# Patient Record
Sex: Male | Born: 1995 | Race: White | Hispanic: No | Marital: Single | Smoking: Never smoker
Health system: Southern US, Community
[De-identification: ages and names within clinical notes are randomized; demographics above are authoritative.]

## PROBLEM LIST (undated history)

## (undated) DIAGNOSIS — I514 Myocarditis, unspecified: Secondary | ICD-10-CM

## (undated) DIAGNOSIS — Z9289 Personal history of other medical treatment: Secondary | ICD-10-CM

## (undated) DIAGNOSIS — J020 Streptococcal pharyngitis: Secondary | ICD-10-CM

## (undated) HISTORY — DX: Myocarditis, unspecified: I51.4

## (undated) HISTORY — PX: OTHER SURGICAL HISTORY: SHX169

## (undated) HISTORY — DX: Personal history of other medical treatment: Z92.89

---

## 2015-08-23 DIAGNOSIS — J020 Streptococcal pharyngitis: Secondary | ICD-10-CM

## 2015-08-23 HISTORY — DX: Streptococcal pharyngitis: J02.0

## 2015-09-15 ENCOUNTER — Observation Stay
Admission: EM | Admit: 2015-09-15 | Discharge: 2015-09-15 | Disposition: A | Discharge status: Home / Self Care | Payer: Managed Care, Other (non HMO) | Attending: Internal Medicine | Admitting: Internal Medicine

## 2015-09-15 ENCOUNTER — Encounter
Admission: EM | Disposition: A | Discharge status: Home / Self Care | Payer: Self-pay | Source: Home / Self Care | Attending: Emergency Medicine

## 2015-09-15 ENCOUNTER — Other Ambulatory Visit: Payer: Self-pay | Admitting: Cardiovascular Disease

## 2015-09-15 ENCOUNTER — Emergency Department: Payer: Managed Care, Other (non HMO)

## 2015-09-15 ENCOUNTER — Observation Stay (HOSPITAL_BASED_OUTPATIENT_CLINIC_OR_DEPARTMENT_OTHER)
Admit: 2015-09-15 | Discharge: 2015-09-15 | Disposition: A | Discharge status: Home / Self Care | Payer: Managed Care, Other (non HMO) | Attending: Student | Admitting: Student

## 2015-09-15 ENCOUNTER — Encounter: Payer: Self-pay | Admitting: *Deleted

## 2015-09-15 DIAGNOSIS — I209 Angina pectoris, unspecified: Secondary | ICD-10-CM

## 2015-09-15 DIAGNOSIS — I319 Disease of pericardium, unspecified: Secondary | ICD-10-CM | POA: Diagnosis present

## 2015-09-15 DIAGNOSIS — I2510 Atherosclerotic heart disease of native coronary artery without angina pectoris: Secondary | ICD-10-CM | POA: Diagnosis not present

## 2015-09-15 DIAGNOSIS — R079 Chest pain, unspecified: Secondary | ICD-10-CM

## 2015-09-15 DIAGNOSIS — Z8042 Family history of malignant neoplasm of prostate: Secondary | ICD-10-CM | POA: Diagnosis not present

## 2015-09-15 DIAGNOSIS — Q245 Malformation of coronary vessels: Secondary | ICD-10-CM | POA: Diagnosis not present

## 2015-09-15 DIAGNOSIS — I340 Nonrheumatic mitral (valve) insufficiency: Secondary | ICD-10-CM | POA: Insufficient documentation

## 2015-09-15 DIAGNOSIS — R509 Fever, unspecified: Secondary | ICD-10-CM | POA: Insufficient documentation

## 2015-09-15 DIAGNOSIS — I071 Rheumatic tricuspid insufficiency: Secondary | ICD-10-CM | POA: Insufficient documentation

## 2015-09-15 DIAGNOSIS — I214 Non-ST elevation (NSTEMI) myocardial infarction: Secondary | ICD-10-CM | POA: Diagnosis not present

## 2015-09-15 DIAGNOSIS — R748 Abnormal levels of other serum enzymes: Secondary | ICD-10-CM | POA: Insufficient documentation

## 2015-09-15 DIAGNOSIS — R9431 Abnormal electrocardiogram [ECG] [EKG]: Secondary | ICD-10-CM | POA: Diagnosis not present

## 2015-09-15 DIAGNOSIS — R7989 Other specified abnormal findings of blood chemistry: Secondary | ICD-10-CM | POA: Diagnosis present

## 2015-09-15 DIAGNOSIS — R778 Other specified abnormalities of plasma proteins: Secondary | ICD-10-CM | POA: Insufficient documentation

## 2015-09-15 HISTORY — DX: Streptococcal pharyngitis: J02.0

## 2015-09-15 HISTORY — PX: CARDIAC CATHETERIZATION: SHX172

## 2015-09-15 LAB — BASIC METABOLIC PANEL
Anion gap: 8 (ref 5–15)
BUN: 9 mg/dL (ref 6–20)
CALCIUM: 9.3 mg/dL (ref 8.9–10.3)
CHLORIDE: 101 mmol/L (ref 101–111)
CO2: 27 mmol/L (ref 22–32)
CREATININE: 0.88 mg/dL (ref 0.61–1.24)
GFR calc non Af Amer: >60 mL/min (ref >60)
GLUCOSE: 113 mg/dL — AB (ref 65–99)
Potassium: 3.7 mmol/L (ref 3.5–5.1)
Sodium: 136 mmol/L (ref 135–145)

## 2015-09-15 LAB — URINE DRUG SCREEN, QUALITATIVE (ARMC ONLY)
AMPHETAMINES, UR SCREEN: NOT DETECTED
Barbiturates, Ur Screen: NOT DETECTED
Benzodiazepine, Ur Scrn: NOT DETECTED
Cannabinoid 50 Ng, Ur ~~LOC~~: NOT DETECTED
Cocaine Metabolite,Ur ~~LOC~~: NOT DETECTED
MDMA (ECSTASY) UR SCREEN: NOT DETECTED
Methadone Scn, Ur: NOT DETECTED
Opiate, Ur Screen: NOT DETECTED
Phencyclidine (PCP) Ur S: NOT DETECTED
TRICYCLIC, UR SCREEN: NOT DETECTED

## 2015-09-15 LAB — FIBRIN DERIVATIVES D-DIMER (ARMC ONLY): Fibrin derivatives D-dimer (ARMC): 655 — ABNORMAL HIGH (ref 0–499)

## 2015-09-15 LAB — CBC
HEMATOCRIT: 44.5 % (ref 40.0–52.0)
HEMOGLOBIN: 15.5 g/dL (ref 13.0–18.0)
MCH: 29.4 pg (ref 26.0–34.0)
MCHC: 34.7 g/dL (ref 32.0–36.0)
MCV: 84.6 fL (ref 80.0–100.0)
Platelets: 218 10*3/uL (ref 150–440)
RBC: 5.26 MIL/uL (ref 4.40–5.90)
RDW: 13.2 % (ref 11.5–14.5)
WBC: 9.2 10*3/uL (ref 3.8–10.6)

## 2015-09-15 LAB — PROTIME-INR
INR: 1.58
Prothrombin Time: 18.9 seconds — ABNORMAL HIGH (ref 11.4–15.0)

## 2015-09-15 LAB — TROPONIN I
TROPONIN I: 1.65 ng/mL — AB (ref <0.031)
TROPONIN I: 3.92 ng/mL — AB (ref <0.031)
TROPONIN I: 2.13 ng/mL — AB (ref <0.031)

## 2015-09-15 SURGERY — LEFT HEART CATH
Anesthesia: Moderate Sedation

## 2015-09-15 MED ORDER — SODIUM CHLORIDE 0.9 % IJ SOLN: 3.0000 mL | INTRAMUSCULAR | Status: DC | PRN

## 2015-09-15 MED ORDER — FENTANYL CITRATE (PF) 100 MCG/2ML IJ SOLN
INTRAMUSCULAR | Status: DC | PRN
  Administered 2015-09-15: 25 ug via INTRAVENOUS
  Administered 2015-09-15: 50 ug via INTRAVENOUS
  Administered 2015-09-15: 25 ug via INTRAVENOUS

## 2015-09-15 MED ORDER — MORPHINE SULFATE (PF) 2 MG/ML IV SOLN: 2.0000 mg | INTRAVENOUS | Status: DC | PRN

## 2015-09-15 MED ORDER — ASPIRIN EC 81 MG PO TBEC
81.0000 mg | DELAYED_RELEASE_TABLET | Freq: Every day | ORAL | Status: DC
Start: 2015-09-16 — End: 2015-09-15

## 2015-09-15 MED ORDER — METOPROLOL SUCCINATE ER 50 MG PO TB24
25.0000 mg | ORAL_TABLET | Freq: Every day | ORAL | Status: DC
  Administered 2015-09-15: 25 mg via ORAL
  Filled 2015-09-15: qty 1

## 2015-09-15 MED ORDER — MIDAZOLAM HCL 2 MG/2ML IJ SOLN
INTRAMUSCULAR | Status: AC
  Filled 2015-09-15: qty 2

## 2015-09-15 MED ORDER — ACETAMINOPHEN 325 MG PO TABS: 650.0000 mg | ORAL_TABLET | ORAL | Status: DC | PRN

## 2015-09-15 MED ORDER — SODIUM CHLORIDE 0.9 % IV SOLN: 250.0000 mL | INTRAVENOUS | Status: DC | PRN

## 2015-09-15 MED ORDER — SODIUM CHLORIDE 0.9 % WEIGHT BASED INFUSION
3.0000 mL/kg/h | INTRAVENOUS | Status: AC
  Administered 2015-09-15: 3 mL/kg/h via INTRAVENOUS

## 2015-09-15 MED ORDER — METOPROLOL SUCCINATE ER 25 MG PO TB24: 25.0000 mg | ORAL_TABLET | Freq: Every day | ORAL | Status: DC

## 2015-09-15 MED ORDER — ASPIRIN 81 MG PO CHEW: 324.0000 mg | CHEWABLE_TABLET | ORAL | Status: DC

## 2015-09-15 MED ORDER — HEPARIN (PORCINE) IN NACL 2-0.9 UNIT/ML-% IJ SOLN
INTRAMUSCULAR | Status: AC
  Filled 2015-09-15: qty 1000

## 2015-09-15 MED ORDER — NITROGLYCERIN 0.4 MG SL SUBL: 0.4000 mg | SUBLINGUAL_TABLET | SUBLINGUAL | Status: AC | PRN

## 2015-09-15 MED ORDER — ENOXAPARIN SODIUM 100 MG/ML ~~LOC~~ SOLN
1.0000 mg/kg | Freq: Once | SUBCUTANEOUS | Status: AC
  Administered 2015-09-15: 90 mg via SUBCUTANEOUS
  Filled 2015-09-15: qty 1

## 2015-09-15 MED ORDER — SODIUM CHLORIDE 0.9 % IJ SOLN: 3.0000 mL | Freq: Two times a day (BID) | INTRAMUSCULAR | Status: DC

## 2015-09-15 MED ORDER — SODIUM CHLORIDE 0.9 % IV SOLN
INTRAVENOUS | Status: DC
  Administered 2015-09-15: 08:00:00 via INTRAVENOUS

## 2015-09-15 MED ORDER — FENTANYL CITRATE (PF) 100 MCG/2ML IJ SOLN
INTRAMUSCULAR | Status: AC
  Filled 2015-09-15: qty 2

## 2015-09-15 MED ORDER — ONDANSETRON HCL 4 MG/2ML IJ SOLN: 4.0000 mg | Freq: Four times a day (QID) | INTRAMUSCULAR | Status: DC | PRN

## 2015-09-15 MED ORDER — IOHEXOL 300 MG/ML  SOLN
INTRAMUSCULAR | Status: DC | PRN
  Administered 2015-09-15: 110 mL via INTRA_ARTICULAR

## 2015-09-15 MED ORDER — ASPIRIN 300 MG RE SUPP: 300.0000 mg | RECTAL | Status: DC

## 2015-09-15 MED ORDER — MIDAZOLAM HCL 2 MG/2ML IJ SOLN
INTRAMUSCULAR | Status: DC | PRN
  Administered 2015-09-15: 1 mg via INTRAVENOUS
  Administered 2015-09-15 (×2): 0.5 mg via INTRAVENOUS

## 2015-09-15 MED ORDER — ASPIRIN 81 MG PO CHEW
324.0000 mg | CHEWABLE_TABLET | Freq: Once | ORAL | Status: AC
  Administered 2015-09-15: 324 mg via ORAL
  Filled 2015-09-15: qty 4

## 2015-09-15 MED ORDER — PANTOPRAZOLE SODIUM 40 MG PO TBEC
40.0000 mg | DELAYED_RELEASE_TABLET | Freq: Every day | ORAL | Status: DC
  Administered 2015-09-15: 40 mg via ORAL
  Filled 2015-09-15: qty 1

## 2015-09-15 MED ORDER — ASPIRIN 81 MG PO CHEW: 81.0000 mg | CHEWABLE_TABLET | Freq: Every day | ORAL | Status: DC

## 2015-09-15 MED ORDER — IOHEXOL 350 MG/ML SOLN
100.0000 mL | Freq: Once | INTRAVENOUS | Status: AC | PRN
  Administered 2015-09-15: 100 mL via INTRAVENOUS

## 2015-09-15 MED ORDER — KETOROLAC TROMETHAMINE 30 MG/ML IJ SOLN
30.0000 mg | Freq: Once | INTRAMUSCULAR | Status: AC
  Administered 2015-09-15: 30 mg via INTRAVENOUS
  Filled 2015-09-15: qty 1

## 2015-09-15 MED ORDER — NITROGLYCERIN 0.4 MG SL SUBL
0.4000 mg | SUBLINGUAL_TABLET | SUBLINGUAL | Status: DC | PRN
Start: 2015-09-15 — End: 2015-09-15

## 2015-09-15 MED ORDER — AMOXICILLIN-POT CLAVULANATE 875-125 MG PO TABS
1.0000 | ORAL_TABLET | Freq: Two times a day (BID) | ORAL | Status: DC
  Administered 2015-09-15: 1 via ORAL
  Filled 2015-09-15 (×2): qty 1

## 2015-09-15 MED ORDER — NITROGLYCERIN 2 % TD OINT
1.0000 [in_us] | TOPICAL_OINTMENT | Freq: Four times a day (QID) | TRANSDERMAL | Status: DC
Start: 2015-09-15 — End: 2015-09-15
  Administered 2015-09-15: 1 [in_us] via TOPICAL
  Filled 2015-09-15: qty 1

## 2015-09-15 MED ORDER — ENOXAPARIN SODIUM 100 MG/ML ~~LOC~~ SOLN: 1.0000 mg/kg | Freq: Two times a day (BID) | SUBCUTANEOUS | Status: DC

## 2015-09-15 MED ORDER — ASPIRIN 81 MG PO TBEC: 81.0000 mg | DELAYED_RELEASE_TABLET | Freq: Every day | ORAL | Status: AC

## 2015-09-15 MED ORDER — SODIUM CHLORIDE 0.9 % WEIGHT BASED INFUSION
1.0000 mL/kg/h | INTRAVENOUS | Status: DC
Start: 2015-09-15 — End: 2015-09-15
  Administered 2015-09-15: 1 mL/kg/h via INTRAVENOUS

## 2015-09-15 SURGICAL SUPPLY — 10 items
CATH INFINITI 5FR ANG PIGTAIL (CATHETERS) ×3 IMPLANT
CATH INFINITI 5FR JL4 (CATHETERS) ×3 IMPLANT
CATH INFINITI JR4 5F (CATHETERS) ×3 IMPLANT
DEVICE CLOSURE MYNXGRIP 5F (Vascular Products) ×3 IMPLANT
KIT MANI 3VAL PERCEP (MISCELLANEOUS) ×3 IMPLANT
NEEDLE PERC 18GX7CM (NEEDLE) ×3 IMPLANT
NEEDLE SMART 18G ACCESS (NEEDLE) ×3 IMPLANT
PACK CARDIAC CATH (CUSTOM PROCEDURE TRAY) ×3 IMPLANT
SHEATH AVANTI 5FR X 11CM (SHEATH) ×3 IMPLANT
WIRE EMERALD 3MM-J .035X150CM (WIRE) ×3 IMPLANT

## 2015-09-15 NOTE — Progress Notes (Signed)
ANTICOAGULATION CONSULT NOTE - Initial Consult  Pharmacy Consult for Lovenox Indication: chest pain/ACS  No Known Allergies  Patient Measurements: Height:  (175.3 cm) Weight: 195 lb (88.451 kg) IBW/kg (Calculated) : 70.7   Vital Signs: Temp: 98.3 F (36.8 C) (01/24 0714) Temp Source: Oral (01/24 0714) BP: 119/77 mmHg (01/24 0714) Pulse Rate: 65 (01/24 0714)  Labs:  Recent Labs  09/15/15 0139 09/15/15 0501  HGB 15.5  --   HCT 44.5  --   PLT 218  --   CREATININE 0.88  --   TROPONINI 1.65* 3.92*    Estimated Creatinine Clearance: 146.1 mL/min (by C-G formula based on Cr of 0.88).   Medical History: History reviewed. No pertinent past medical history.  Medications:  Scheduled:  . amoxicillin-clavulanate  1 tablet Oral Q12H  . aspirin  324 mg Oral NOW   Or  . aspirin  300 mg Rectal NOW  . [START ON 09/16/2015] aspirin EC  81 mg Oral Daily  . enoxaparin (LOVENOX) injection  1 mg/kg Subcutaneous Q12H  . nitroGLYCERIN  1 inch Topical 4 times per day  . pantoprazole  40 mg Oral Daily   Infusions:  . sodium chloride      Assessment: Patient is a 20 yo male presented to ED with chest pain. Pharmacy consulted to dose Lovenox for anticoagulation for possible ACS/NSTEMI/chest pain.   Est CrCl~120 mL/min, Wt: 88.5 kg  Goal of Therapy:  Monitor CBC and SCr per policy   Plan:  Patient received Lovenox 90 mg subq once in ED at 0545.  Will transition patient to Lovenox 1 mg/kg subq q12h based on renal function.  Orders entered for Lovenox 90 mg subq q12h with start time of 1800.    Pharmacy will continue to follow.   Jakylah Bassinger G 09/15/2015,7:28 AM

## 2015-09-15 NOTE — Progress Notes (Signed)
*  PRELIMINARY RESULTS* Echocardiogram 2D Echocardiogram has been performed.  Brad Bernard Hege 09/15/2015, 10:00 AM

## 2015-09-15 NOTE — Discharge Instructions (Signed)
No strenuous activity or contact sports

## 2015-09-15 NOTE — Discharge Summary (Signed)
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 Strep throat 08/2015    HOSPITAL COURSE:   1. Elevated troponin and chest pain. 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 Cardiology recommended aspirin, metoprolol and when necessary nitroglycerin. Dr. Mariah Milling is not calling this a myocardial infarction. He recommended no strenuous activity. And follow-up as outpatient with Dr. Mariah Milling one week. 2. Strep throat can tin you his Penicillin VK  DISCHARGE CONDITIONS:   Satisfactory  CONSULTS OBTAINED:  Treatment Team:  Antonieta Iba, MD  DRUG ALLERGIES:  No Known Allergies  DISCHARGE MEDICATIONS:   Current Discharge Medication List    START taking these medications   Details  aspirin EC 81 MG EC tablet Take 1 tablet (81 mg total) by mouth daily. Qty: 30 tablet, Refills: 0    metoprolol succinate (TOPROL-XL) 25 MG 24 hr tablet Take 1 tablet (25 mg total) by mouth daily. Take with or immediately following a meal. Qty: 30 tablet, Refills: 0    nitroGLYCERIN (NITROSTAT) 0.4 MG SL tablet Place 1 tablet (0.4 mg total) under the tongue every 5 (five) minutes x 3 doses as needed for chest pain (as needed for chest pain). Qty: 30 tablet, Refills: 0      CONTINUE these medications which have NOT CHANGED   Details  penicillin v potassium (VEETID) 500 MG tablet Take 500 mg by  mouth 2 (two) times daily.         DISCHARGE INSTRUCTIONS:   Follow-up with Dr. Mariah Milling cardiology 1 week  If you experience worsening of your admission symptoms, develop shortness of breath, life threatening emergency, suicidal or homicidal thoughts you must seek medical attention immediately by calling 911 or calling your MD immediately  if symptoms less severe.  You Must read complete instructions/literature along with all the possible adverse reactions/side effects for all the Medicines you take and that have been prescribed to you. Take any new Medicines after you have completely understood and accept all the possible adverse reactions/side effects.   Please note  You were cared for by a hospitalist during your hospital stay. If you have any questions about your discharge medications or the care you received while you were in the hospital after you are discharged, you can call the unit and asked to speak with the hospitalist on call if the hospitalist that took care of you is not available. Once you are discharged, your primary care physician will handle any further medical issues. Please note that NO REFILLS for any discharge medications will be authorized once you are discharged, as it is imperative that you return to your primary care physician (or establish a relationship with a primary care physician if you do not have one) for your aftercare needs so that they can reassess your need for medications and monitor your lab values.    Today   CHIEF COMPLAINT:   Chief Complaint  Patient presents with  . Chest Pain  HISTORY OF PRESENT ILLNESS:  Brad Bernard  is a 20 y.o. male recent diagnosis of strep throat and developed chest pain and found to have an elevated troponin   VITAL SIGNS:  Blood pressure 127/81, pulse 97, temperature 98.9 F (37.2 C), temperature source Oral, resp. rate 16, height  (1.753 m), weight 88.451 kg (195 lb), SpO2 99 %.    PHYSICAL  EXAMINATION:  GENERAL:  20 y.o.-year-old patient lying in the bed with no acute distress.  EYES: Pupils equal, round, reactive to light and accommodation. No scleral icterus. Extraocular muscles intact.  HEENT: Head atraumatic, normocephalic. Throat slight erythema but no exudates NECK:  Supple, no jugular venous distention. No thyroid enlargement, no tenderness.  LUNGS: Normal breath sounds bilaterally, no wheezing, rales,rhonchi or crepitation. No use of accessory muscles of respiration.  CARDIOVASCULAR: S1, S2 normal. No murmurs, rubs, or gallops.  ABDOMEN: Soft, non-tender, non-distended. Bowel sounds present. No organomegaly or mass.  EXTREMITIES: No pedal edema, cyanosis, or clubbing.  NEUROLOGIC: Cranial nerves II through XII are intact. Muscle strength 5/5 in all extremities. Sensation intact. Gait not checked.  PSYCHIATRIC: The patient is alert and oriented x 3.  SKIN: No obvious rash, lesion, or ulcer.   DATA REVIEW:   CBC  Recent Labs Lab 09/15/15 0139  WBC 9.2  HGB 15.5  HCT 44.5  PLT 218    Chemistries   Recent Labs Lab 09/15/15 0139  NA 136  K 3.7  CL 101  CO2 27  GLUCOSE 113*  BUN 9  CREATININE 0.88  CALCIUM 9.3    Cardiac Enzymes  Recent Labs Lab 09/15/15 1157  TROPONINI 2.13*    RADIOLOGY:  Dg Chest 2 View  09/15/2015  CLINICAL DATA:  Acute onset of mid chest pain.  Initial encounter. EXAM: CHEST  2 VIEW COMPARISON:  None. FINDINGS: The lungs are well-aerated and clear. There is no evidence of focal opacification, pleural effusion or pneumothorax. The heart is normal in size; the mediastinal contour is within normal limits. No acute osseous abnormalities are seen. IMPRESSION: No acute cardiopulmonary process seen. Electronically Signed   By: Roanna Raider M.D.   On: 09/15/2015 02:08   Ct Angio Chest Pe W/cm &/or Wo Cm  09/15/2015  CLINICAL DATA:  Acute onset of central chest pain. Initial encounter. EXAM: CT ANGIOGRAPHY CHEST WITH CONTRAST  TECHNIQUE: Multidetector CT imaging of the chest was performed using the standard protocol during bolus administration of intravenous contrast. Multiplanar CT image reconstructions and MIPs were obtained to evaluate the vascular anatomy. CONTRAST:  OMNIPAQUE IOHEXOL 350 MG/ML SOLN COMPARISON:  Chest radiograph performed earlier today at 1:57 a.m. FINDINGS: There is no evidence of pulmonary embolus. The lungs are clear bilaterally. There is no evidence of significant focal consolidation, pleural effusion or pneumothorax. No masses are identified; no abnormal focal contrast enhancement is seen. The mediastinum is unremarkable appearance. No mediastinal lymphadenopathy is seen. No pericardial effusion is identified. The great vessels are grossly unremarkable in appearance. No axillary lymphadenopathy is seen. The visualized portions of the thyroid gland are unremarkable in appearance. The visualized portions of the liver and spleen are unremarkable. The visualized portions of the pancreas, stomach, adrenal glands and right kidney are within normal limits. No acute osseous abnormalities are seen. Review of the MIP images confirms the above findings. IMPRESSION: No evidence of pulmonary embolus.  Lungs clear bilaterally. Electronically Signed   By: Roanna Raider M.D.   On: 09/15/2015 04:57    Management plans discussed with the  patient, and cardiology and they are in agreement. Dr. Mariah Milling discussed the case with the patient's mother who is on her way to pick him up.  CODE STATUS:     Code Status Orders        Start     Ordered   09/15/15 0717  Full code   Continuous     09/15/15 0716    Code Status History    Date Active Date Inactive Code Status Order ID Comments User Context   This patient has a current code status but no historical code status.      TOTAL TIME TAKING CARE OF THIS PATIENT: 35 minutes.    Alford Highland M.D on 09/15/2015 at 4:57 PM  Between 7am to 6pm - Pager -  [phone]  After 6pm go to [email or web address] - password [email or web address] [facility]  [place]  Office  [phone]  CC: Primary care physician; Dr. Julien Nordmann

## 2015-09-15 NOTE — ED Notes (Signed)
Pt c/o central chest paint hat started around 2200. Pt states he was dx with strep throat on Sunday, treated with penicillin and nyquil. Pt denies SOD, dizziness and radiating pain.

## 2015-09-15 NOTE — ED Notes (Signed)
Pt reports chest pain tonight.  Pt states pain in center of chest.  nonradiating pain.  No sob.  Nonsmoker.  Treated for strep currently and is taking penicillin and nyquil

## 2015-09-15 NOTE — Progress Notes (Addendum)
Patient given discharge teaching and paperwork regarding medications, diet, follow-up appointments, vascular access site care and activity. Patient understanding verbalized. No complaints at this time. IV and telemetry discontinued prior to leaving. Skin assessment as previously charted and vitals are stable; on room air. Patient being discharged to home. Caregiver/family present during discharge teaching. Prescriptions printed and given to patient.

## 2015-09-15 NOTE — ED Provider Notes (Signed)
[facility] Emergency Department Provider Note  ____________________________________________  Time seen: Approximately 223 AM  I have reviewed the triage vital signs and the nursing notes.   HISTORY  Chief Complaint Chest Pain    HPI Ranier Coach is a 20 y.o. male who comes into the hospital with some chest pain. The patient reports it started a few hours ago. He reports that yesterday he found that he tripped throat and started on penicillin. He also took some NyQuil last night. The patient reports that the pain is worse when he lays down and it better when he is up and walking around. He denies any shortness of breath but reports he hasn't mild cough that also started yesterday. He reports that his chest hurts a little bit when he coughs as well but is not as intense as it is now. He rates pain as a 5 out of 10 in intensity and says it is achy pain but it does have moments of being sharp. Yesterday the patient had temperature of 101 but reports that this morning it seems to be better. He says that the pain started around 10 PM and he has not been sleeping well due to being uncomfortable. He reports that breathing is also making his pain worse but it seems to be worse when he lays back. The patient has had some sweats but he attributed that to fever. He denies any nausea or vomiting and has not had any dizziness.   History reviewed. No pertinent past medical history.  Patient Active Problem List   Diagnosis Date Noted  . Chest pain at rest 09/15/2015  . Non-STEMI (non-ST elevated myocardial infarction) (HCC) 09/15/2015    Past Surgical History  Procedure Laterality Date  . None      No current outpatient prescriptions   Allergies Review of patient's allergies indicates no known allergies.  Family History  Problem Relation Age of Onset  . Prostate cancer Paternal Grandfather     Social History Social History  Substance Use Topics  . Smoking  status: Never Smoker   . Smokeless tobacco: None  . Alcohol Use: 0.0 oz/week    0 Standard drinks or equivalent per week     Comment: on the weekends beer and wine    Review of Systems Constitutional: No fever/chills Eyes: No visual changes. ENT: No sore throat. Cardiovascular: chest pain. Respiratory: Cough but Denies shortness of breath. Gastrointestinal: No abdominal pain.  No nausea, no vomiting.  No diarrhea.  No constipation. Genitourinary: Negative for dysuria. Musculoskeletal: Negative for back pain. Skin: Negative for rash. Neurological: Negative for headaches, focal weakness or numbness.  10-point ROS otherwise negative.  ____________________________________________   PHYSICAL EXAM:  VITAL SIGNS: ED Triage Vitals  Enc Vitals Group     BP 09/15/15 0134 137/95 mmHg     Pulse Rate 09/15/15 0134 63     Resp 09/15/15 0134 18     Temp 09/15/15 0134 98.8 F (37.1 C)     Temp Source 09/15/15 0134 Oral     SpO2 09/15/15 0134 99 %     Weight 09/15/15 0134 195 lb (88.451 kg)     Height 09/15/15 0134  (1.753 m)     Head Cir --      Peak Flow --      Pain Score 09/15/15 0135 5     Pain Loc --      Pain Edu? --      Excl. in GC? --  Constitutional: Alert and oriented. Well appearing and in mild distress. Eyes: Conjunctivae are normal. PERRL. EOMI. Head: Atraumatic. Nose: No congestion/rhinnorhea. Mouth/Throat: Mucous membranes are moist.  Oropharynx non-erythematous. Cardiovascular: Normal rate, regular rhythm. Grossly normal heart sounds.  Good peripheral circulation. Respiratory: Normal respiratory effort.  No retractions. Lungs CTAB. Gastrointestinal: Soft and nontender. No distention. Positive bowel sounds Musculoskeletal: No lower extremity tenderness nor edema.  Neurologic:  Normal speech and language.  Skin:  Skin is warm, dry and intact.  Psychiatric: Mood and affect are normal.   ____________________________________________   LABS (all labs  ordered are listed, but only abnormal results are displayed)  Labs Reviewed  TROPONIN I - Abnormal; Notable for the following:    Troponin I 1.65 (*)    All other components within normal limits  BASIC METABOLIC PANEL - Abnormal; Notable for the following:    Glucose, Bld 113 (*)    All other components within normal limits  FIBRIN DERIVATIVES D-DIMER ([facility] ONLY) - Abnormal; Notable for the following:    Fibrin derivatives D-dimer ([facility]) 655 (*)    All other components within normal limits  TROPONIN I - Abnormal; Notable for the following:    Troponin I 3.92 (*)    All other components within normal limits  CBC  TROPONIN I  TROPONIN I  TROPONIN I   ____________________________________________  EKG  ED ECG REPORT I, Rebecka Apley, the attending physician, personally viewed and interpreted this ECG.   Date: 09/15/2015  EKG Time: 132  Rate: 59  Rhythm: normal sinus rhythm  Axis: none  Intervals:none  ST&T Change: none  ____________________________________________  RADIOLOGY  Chest x-ray: No acute cardiopulmonary process seen  CT chest angiogram: No evidence of pulmonary embolus, lungs clear bilaterally ____________________________________________   PROCEDURES  Procedure(s) performed: None  Critical Care performed: Yes, see critical care note(s)   CRITICAL CARE Performed by: Lucrezia Europe P   Total critical care time: 30 minutes  Critical care time was exclusive of separately billable procedures and treating other patients.  Critical care was necessary to treat or prevent imminent or life-threatening deterioration.  Critical care was time spent personally by me on the following activities: development of treatment plan with patient and/or surrogate as well as nursing, discussions with consultants, evaluation of patient's response to treatment, examination of patient, obtaining history from patient or surrogate, ordering and performing treatments and  interventions, ordering and review of laboratory studies, ordering and review of radiographic studies, pulse oximetry and re-evaluation of patient's condition.  ____________________________________________   INITIAL IMPRESSION / ASSESSMENT AND PLAN / ED COURSE  Pertinent labs & imaging results that were available during my care of the patient were reviewed by me and considered in my medical decision making (see chart for details).  This is a 20 year old male who comes into the hospital today with some chest pain. The patient does have an elevated troponin. I will perform a d-dimer as well as give the patient some Toradol. Given the description of the symptoms the patient may have some pericarditis. I will reassess the patient once he is received his d-dimer as well as his Toradol.  The patient did have an elevated d-dimer so we did perform a CTA of his chest. The CT was negative. We also repeated the patient's troponin given his time in the emergency department. The patient's repeat troponin was more elevated at 3.92. The decision was made to give the patient a dose of Lovenox and he will be admitted to the  hospitalist service. ____________________________________________   FINAL CLINICAL IMPRESSION(S) / ED DIAGNOSES  Final diagnoses:  Chest pain, unspecified chest pain type  Elevated troponin  Pericarditis  NSTEMI (non-ST elevated myocardial infarction) (HCC)      Rebecka Apley, MD 09/15/15 [phone]

## 2015-09-15 NOTE — Consult Note (Signed)
CARDIOLOGY CONSULT NOTE   Patient ID: Brad Bernard MRN: [id number], DOB/AGE: 1996-02-03   Admit date: 09/15/2015 Date of Consult: 09/15/2015 Reason for Consult: Chest Pain Physician Requesting Consult: Dr. Tobi Bastos   Primary Physician: Pcp Not In System Primary Cardiologist: New to [facility]  HPI:  Brad Bernard is a 20 y.o. male with no significant past medical history who presented to [facility] on 09/15/2015 for evaluation of chest pain.   The patient reports he developed a sternal shooting chest pain which lasted several minutes around 10:00PM on 09/14/2015. The pain was not accompanied by dyspnea, diaphoresis, nausea, or vomiting. He reports the pain was worse when lying down and relieved with sitting up. He drove himself to the ED, had relief of his pain with walking into the hospital which prompted him to leave, then had a repeat episode of chest pain while sitting in his car which brought him back into the ED for evaluation.  He reports having been diagnosed with Strep Throat over the weekend. Started on Augmentin on 09/13/2015 and reports improvement in his symptoms since. Denies any recent fever or chills.  Since admitted, his D-dimer was found to be elevated to 655. Initial troponin was 1.65 with repeat value trending up to 3.92. EKG shows ST elevation in Leads II, III, and AVF along with deep TWI in V1. CXR showed no acute cardiopulmonary processes. CTA was negative for PE. He was given Toradol in the ED with improvement in his symptoms. Had repeat pain this morning which was slightly relieved with NTG ointment.  At the time of this encounter, he denies any current pain. Is sitting up comfortably in bed.   He denies any significant past medical history besides his recent diagnosis of strep throat. Denies any past episodes of chest pain. Is an active Archivist at [employer] and denies any chest pain or dyspnea on exertion with recent exercise. Reports no known family history of  CAD. Denies tobacco or illicit drug use. Reports occasional alcohol use on the weekends. Is is originally from [place] and that is where his family is currently.   Problem List Past Medical History  Diagnosis Date  . Strep throat 08/2015    Past Surgical History  Procedure Laterality Date  . None       Allergies No Known Allergies   Inpatient Medications . amoxicillin-clavulanate  1 tablet Oral Q12H  . aspirin  324 mg Oral NOW   Or  . aspirin  300 mg Rectal NOW  . [START ON 09/16/2015] aspirin EC  81 mg Oral Daily  . nitroGLYCERIN  1 inch Topical 4 times per day  . pantoprazole  40 mg Oral Daily    Family History Family History  Problem Relation Age of Onset  . Prostate cancer Paternal Grandfather      Social History Social History   Social History  . Marital Status: Single    Spouse Name: N/A  . Number of Children: N/A  . Years of Education: N/A   Occupational History  . student at [employer].    Social History Main Topics  . Smoking status: Never Smoker   . Smokeless tobacco: Not on file  . Alcohol Use: 0.0 oz/week    0 Standard drinks or equivalent per week     Comment: on the weekends beer and wine  . Drug Use: No  . Sexual Activity: Not on file   Other Topics Concern  . Not on file   Social History Narrative  Studies Finance at [employer]     Review of Systems General:  No chills, fever, night sweats or weight changes.  Cardiovascular:  No dyspnea on exertion, edema, orthopnea, palpitations, paroxysmal nocturnal dyspnea. Positive for chest pain. Dermatological: No rash, lesions/masses Respiratory: No cough, dyspnea Urologic: No hematuria, dysuria Abdominal:   No nausea, vomiting, diarrhea, bright red blood per rectum, melena, or hematemesis. Positive for sore throat. Neurologic:  No visual changes, wkns, changes in mental status. All other systems reviewed and are otherwise negative except as noted above.  Physical Exam  Blood pressure  119/77, pulse 65, temperature 98.3 F (36.8 C), temperature source Oral, resp. rate 18, height  (1.753 m), weight 195 lb (88.451 kg), SpO2 98 %.  General: Pleasant, young Caucasian male appearing in NAD Psych: Normal affect. Neuro: Alert and oriented X 3. Moves all extremities spontaneously. HEENT: Normal  Neck: Supple without bruits or JVD. Lungs:  Resp regular and unlabored, CTA without wheezing or rales. Heart: RRR no s3, s4, or murmurs. No friction rub appreciated. Abdomen: Soft, non-tender, non-distended, BS + x 4.  Extremities: No clubbing, cyanosis or edema. DP/PT/Radials 2+ and equal bilaterally.  Labs  Recent Labs  09/15/15 0139 09/15/15 0501  TROPONINI 1.65* 3.92*   Lab Results  Component Value Date   WBC 9.2 09/15/2015   HGB 15.5 09/15/2015   HCT 44.5 09/15/2015   MCV 84.6 09/15/2015   PLT 218 09/15/2015     Recent Labs Lab 09/15/15 0139  NA 136  K 3.7  CL 101  CO2 27  BUN 9  CREATININE 0.88  CALCIUM 9.3  GLUCOSE 113*    Radiology/Studies  Dg Chest 2 View: 09/15/2015  CLINICAL DATA:  Acute onset of mid chest pain.  Initial encounter. EXAM: CHEST  2 VIEW COMPARISON:  None. FINDINGS: The lungs are well-aerated and clear. There is no evidence of focal opacification, pleural effusion or pneumothorax. The heart is normal in size; the mediastinal contour is within normal limits. No acute osseous abnormalities are seen. IMPRESSION: No acute cardiopulmonary process seen. Electronically Signed   By: Roanna Raider M.D.   On: 09/15/2015 02:08   Ct Angio Chest Pe W/cm &/or Wo Cm: 09/15/2015  CLINICAL DATA:  Acute onset of central chest pain. Initial encounter. EXAM: CT ANGIOGRAPHY CHEST WITH CONTRAST TECHNIQUE: Multidetector CT imaging of the chest was performed using the standard protocol during bolus administration of intravenous contrast. Multiplanar CT image reconstructions and MIPs were obtained to evaluate the vascular anatomy. CONTRAST:  OMNIPAQUE IOHEXOL  350 MG/ML SOLN COMPARISON:  Chest radiograph performed earlier today at 1:57 a.m. FINDINGS: There is no evidence of pulmonary embolus. The lungs are clear bilaterally. There is no evidence of significant focal consolidation, pleural effusion or pneumothorax. No masses are identified; no abnormal focal contrast enhancement is seen. The mediastinum is unremarkable appearance. No mediastinal lymphadenopathy is seen. No pericardial effusion is identified. The great vessels are grossly unremarkable in appearance. No axillary lymphadenopathy is seen. The visualized portions of the thyroid gland are unremarkable in appearance. The visualized portions of the liver and spleen are unremarkable. The visualized portions of the pancreas, stomach, adrenal glands and right kidney are within normal limits. No acute osseous abnormalities are seen. Review of the MIP images confirms the above findings. IMPRESSION: No evidence of pulmonary embolus.  Lungs clear bilaterally. Electronically Signed   By: Roanna Raider M.D.   On: 09/15/2015 04:57    ECG: Borderline Sinus Bradycardia, HR 59. ST elevation noted in  II, III, AVF. Deep TWI in V1.   ECHOCARDIOGRAM: Pending   ASSESSMENT AND PLAN  1. New Onset Chest Pain/ Elevated Troponin - has no significant medical history, cardiac family history, or known cardiac risk factors. Recently diagnosed with Strep Throat on 09/13/2015. - presented to [facility] on 09/15/2015 for shooting pain in his sternal region, worse when lying down and relieved with sitting up. Denies any associated symptoms. - Initial troponin was 1.65 with repeat value trending up to 3.92. EKG shows ST elevation in Leads II, III, and AVF. - had improvement in his pain with Toradol and NTG ointment. - echocardiogram is pending. - Etiology of his pain includes pericarditis, myocarditis, myopericarditis, and less likely ACS or dissection. CTA was performed and negative for PE. Awaiting results of his echocardiogram. -  Dr. Mariah Milling discussed with the patient and his mother (on the phone) his recommendation for a cardiac catheterization due to his pain, increased cardiac enzymes,and EKG changes. Will plan for cardiac catheterization later today once his morning dose of Lovenox has washed out in an effort to decrease bleeding risks.     Signed, Ellsworth Lennox, PA-C 09/15/2015, 10:15 AM Pager: [phone]

## 2015-09-15 NOTE — Progress Notes (Signed)
Per Dr. Mariah Milling, keep patient NPO and discontinue order for lovenox. Ok to give sips with meds (protonix & augmentin) per PA

## 2015-09-15 NOTE — Progress Notes (Signed)
Patient admitted to unit. Oriented to room, call bell, and staff. Bed in lowest position. Fall safety plan reviewed. Full assessment to Epic. Skin assessment verified with ----Suanne Marker RN-. Telemetry box verification with tele clerk by Candace NT and Careers adviser. Will continue to monitor.

## 2015-09-15 NOTE — H&P (Signed)
[facility] - [place] at [facility]   PATIENT NAME: Brad Bernard    MR#:  [id number]  DATE OF BIRTH:  1996/04/17  DATE OF ADMISSION:  09/15/2015  PRIMARY CARE PHYSICIAN: Pcp Not In System   REQUESTING/REFERRING PHYSICIAN:   CHIEF COMPLAINT:   Chief Complaint  Patient presents with  . Chest Pain    HISTORY OF PRESENT ILLNESS: Brad Bernard  is a 20 y.o. male with no significant past medical history presented to the emergency room with chest pain. Chest pain started last night around 10 PM and patient was admitted in the emergency room around 1:15 AM. Pain is located in the central part of the chest. Pain is 6 out of 10 on a scale of 1-10 and sharp in nature. The pain increases as well as on deep breathing the pain increases. Patient was recently started on antibiotic at on campus clinic at [facility] for strep throat infection. Workup in the emergency room showed elevated troponin and hospitalist service was further consulted. After presentation in the emergency room patient felt better and chest pain was relieved. Was given full dose Lovenox subcutaneously in the emergency room. First set of EKG did not reveal any ST segment changes. No prior cardiac history. Patient had elevated d-dimer, was worked up with a CT angiogram of chest which showed no pulmonary embolism.  PAST MEDICAL HISTORY:  History reviewed. No pertinent past medical history.  PAST SURGICAL HISTORY: Past Surgical History  Procedure Laterality Date  . None      SOCIAL HISTORY:  Social History  Substance Use Topics  . Smoking status: Never Smoker   . Smokeless tobacco: Not on file  . Alcohol Use: 0.0 oz/week    0 Standard drinks or equivalent per week     Comment: on the weekends beer and wine    FAMILY HISTORY:  Family History  Problem Relation Age of Onset  . Prostate cancer Paternal Grandfather     DRUG ALLERGIES: No Known Allergies  REVIEW OF SYSTEMS:   CONSTITUTIONAL:  No fever, fatigue or weakness.  EYES: No blurred or double vision.  EARS, NOSE, AND THROAT: No tinnitus or ear pain.  RESPIRATORY: No cough, shortness of breath, wheezing or hemoptysis.  CARDIOVASCULAR: Has chest pain, orthopnea, edema.  GASTROINTESTINAL: No nausea, vomiting, diarrhea or abdominal pain.  GENITOURINARY: No dysuria, hematuria.  ENDOCRINE: No polyuria, nocturia,  HEMATOLOGY: No anemia, easy bruising or bleeding SKIN: No rash or lesion. MUSCULOSKELETAL: No joint pain or arthritis.   NEUROLOGIC: No tingling, numbness, weakness.  PSYCHIATRY: No anxiety or depression.   MEDICATIONS AT HOME:  Prior to Admission medications   Not on File      PHYSICAL EXAMINATION:   VITAL SIGNS: Blood pressure 122/81, pulse 70, temperature 98.8 F (37.1 C), temperature source Oral, resp. rate 16, height  (1.753 m), weight 88.451 kg (195 lb), SpO2 98 %.  GENERAL:  20 y.o.-year-old patient lying in the bed with no acute distress.  EYES: Pupils equal, round, reactive to light and accommodation. No scleral icterus. Extraocular muscles intact.  HEENT: Head atraumatic, normocephalic. Oropharynx and nasopharynx clear.  NECK:  Supple, no jugular venous distention. No thyroid enlargement, no tenderness.  LUNGS: Normal breath sounds bilaterally, no wheezing, rales,rhonchi or crepitation. No use of accessory muscles of respiration.  CARDIOVASCULAR: S1, S2 normal. No murmurs, rubs, or gallops.  ABDOMEN: Soft, nontender, nondistended. Bowel sounds present. No organomegaly or mass.  EXTREMITIES: No pedal edema, cyanosis, or clubbing.  NEUROLOGIC: Cranial  nerves II through XII are intact. Muscle strength 5/5 in all extremities. Sensation intact. Gait not checked.  PSYCHIATRIC: The patient is alert and oriented x 3.  SKIN: No obvious rash, lesion, or ulcer.   LABORATORY PANEL:   CBC  Recent Labs Lab 09/15/15 0139  WBC 9.2  HGB 15.5  HCT 44.5  PLT 218  MCV 84.6  MCH 29.4  MCHC 34.7  RDW  13.2   ------------------------------------------------------------------------------------------------------------------  Chemistries   Recent Labs Lab 09/15/15 0139  NA 136  K 3.7  CL 101  CO2 27  GLUCOSE 113*  BUN 9  CREATININE 0.88  CALCIUM 9.3   ------------------------------------------------------------------------------------------------------------------ estimated creatinine clearance is 146.1 mL/min (by C-G formula based on Cr of 0.88). ------------------------------------------------------------------------------------------------------------------ No results for input(s): TSH, T4TOTAL, T3FREE, THYROIDAB in the last 72 hours.  Invalid input(s): FREET3   Coagulation profile No results for input(s): INR, PROTIME in the last 168 hours. ------------------------------------------------------------------------------------------------------------------- No results for input(s): DDIMER in the last 72 hours. -------------------------------------------------------------------------------------------------------------------  Cardiac Enzymes  Recent Labs Lab 09/15/15 0139 09/15/15 0501  TROPONINI 1.65* 3.92*   ------------------------------------------------------------------------------------------------------------------ Invalid input(s): POCBNP  ---------------------------------------------------------------------------------------------------------------  Urinalysis No results found for: COLORURINE, APPEARANCEUR, LABSPEC, PHURINE, GLUCOSEU, HGBUR, BILIRUBINUR, KETONESUR, PROTEINUR, UROBILINOGEN, NITRITE, LEUKOCYTESUR   RADIOLOGY: Dg Chest 2 View  09/15/2015  CLINICAL DATA:  Acute onset of mid chest pain.  Initial encounter. EXAM: CHEST  2 VIEW COMPARISON:  None. FINDINGS: The lungs are well-aerated and clear. There is no evidence of focal opacification, pleural effusion or pneumothorax. The heart is normal in size; the mediastinal contour is within normal limits.  No acute osseous abnormalities are seen. IMPRESSION: No acute cardiopulmonary process seen. Electronically Signed   By: Roanna Raider M.D.   On: 09/15/2015 02:08   Ct Angio Chest Pe W/cm &/or Wo Cm  09/15/2015  CLINICAL DATA:  Acute onset of central chest pain. Initial encounter. EXAM: CT ANGIOGRAPHY CHEST WITH CONTRAST TECHNIQUE: Multidetector CT imaging of the chest was performed using the standard protocol during bolus administration of intravenous contrast. Multiplanar CT image reconstructions and MIPs were obtained to evaluate the vascular anatomy. CONTRAST:  OMNIPAQUE IOHEXOL 350 MG/ML SOLN COMPARISON:  Chest radiograph performed earlier today at 1:57 a.m. FINDINGS: There is no evidence of pulmonary embolus. The lungs are clear bilaterally. There is no evidence of significant focal consolidation, pleural effusion or pneumothorax. No masses are identified; no abnormal focal contrast enhancement is seen. The mediastinum is unremarkable appearance. No mediastinal lymphadenopathy is seen. No pericardial effusion is identified. The great vessels are grossly unremarkable in appearance. No axillary lymphadenopathy is seen. The visualized portions of the thyroid gland are unremarkable in appearance. The visualized portions of the liver and spleen are unremarkable. The visualized portions of the pancreas, stomach, adrenal glands and right kidney are within normal limits. No acute osseous abnormalities are seen. Review of the MIP images confirms the above findings. IMPRESSION: No evidence of pulmonary embolus.  Lungs clear bilaterally. Electronically Signed   By: Roanna Raider M.D.   On: 09/15/2015 04:57    EKG: Orders placed or performed during the hospital encounter of 09/15/15  . EKG 12-Lead  . EKG 12-Lead  . ED EKG within 10 minutes  . ED EKG within 10 minutes    IMPRESSION AND PLAN: 20 year old well-built male patient with no significant past medical history presented to the emergency room  with the chest discomfort. Patient had elevated troponin and positive d-dimer. CT angiogram of chest showed no pulmonary embolism. Admitting diagnosis 1.  Chest pain 2. Abnormal troponin 3. Non-ST elevation MI versus pericarditis/myocarditis 4. Elevated d-dimer no evidence of pulmonary embolism Treatment plan Admit patient to telemetry under observation bed Start patient on aspirin Check echocardiogram Consult cardiology Cycle troponin Anticoagulate patient with subcutaneous Lovenox Pain management.  All the records are reviewed and case discussed with ED provider. Management plans discussed with the patient, family and they are in agreement.  CODE STATUS:FULL Code Status History    This patient does not have a recorded code status. Please follow your organizational policy for patients in this situation.       TOTAL TIME TAKING CARE OF THIS PATIENT: .    Ihor Austin M.D on 09/15/2015 at 5:52 AM  Between 7am to 6pm - Pager - [phone]  After 6pm go to [email or web address] - password [email or web address]  [facility]  Office  [phone]  CC: Primary care physician; Pcp Not In System

## 2015-09-16 ENCOUNTER — Encounter: Payer: Self-pay | Admitting: Cardiovascular Disease

## 2015-09-21 ENCOUNTER — Telehealth: Payer: Self-pay

## 2015-09-21 NOTE — Telephone Encounter (Signed)
Spoke w/ pt.  He reports that he had a very active weekend, flew from [place] yesterday and started class today. He has done a lot of walking today, which was advised against in his d/c instructions after his cath. Pt reports that he has a small amount of bleeding in groin at cath site and would like recommendations on getting it to stop. Advised him to apply pressure to cath site and if bleeding continues or soaks his bandage, to proceed to ED. He states that there is not much blood, he is heading to class now and will be sitting for the next 2 hours. Asked him to call back if we can be of further assistance.

## 2015-09-21 NOTE — Telephone Encounter (Signed)
Pt called, states he has bleeding at cath site. Nickel size of blood. States he has been walking a lot to class, thinks it may be due to this. He states it is ok to leave a detailed message on his cell, as he will be in class from 3:15-5:15

## 2015-09-30 ENCOUNTER — Encounter: Payer: Self-pay | Admitting: Physician Assistant

## 2015-10-01 ENCOUNTER — Encounter: Payer: Self-pay | Admitting: Physician Assistant

## 2015-10-01 ENCOUNTER — Ambulatory Visit (INDEPENDENT_AMBULATORY_CARE_PROVIDER_SITE_OTHER): Payer: BLUE CROSS/BLUE SHIELD | Admitting: Physician Assistant

## 2015-10-01 VITALS — BP 120/70 | HR 68 | Ht 69.0 in | Wt 191.5 lb

## 2015-10-01 DIAGNOSIS — I319 Disease of pericardium, unspecified: Secondary | ICD-10-CM

## 2015-10-01 DIAGNOSIS — J020 Streptococcal pharyngitis: Secondary | ICD-10-CM

## 2015-10-01 DIAGNOSIS — I209 Angina pectoris, unspecified: Secondary | ICD-10-CM

## 2015-10-01 DIAGNOSIS — R079 Chest pain, unspecified: Secondary | ICD-10-CM | POA: Diagnosis not present

## 2015-10-01 MED ORDER — METOPROLOL SUCCINATE ER 25 MG PO TB24: 25.0000 mg | ORAL_TABLET | Freq: Every day | ORAL | Status: DC

## 2015-10-01 NOTE — Patient Instructions (Signed)
Medication Instructions:  Your physician recommends that you continue on your current medications as directed. Please refer to the Current Medication list given to you today.   Labwork: none  Testing/Procedures: none  Follow-Up: Your physician recommends that you schedule a follow-up appointment in: April with Dr. Mariah Milling or Dr. Kirke Corin   Any Other Special Instructions Will Be Listed Below (If Applicable).     If you need a refill on your cardiac medications before your next appointment, please call your pharmacy.

## 2015-10-01 NOTE — Progress Notes (Signed)
Cardiology Office Note Date:  10/01/2015  Patient ID:  Brad Bernard, DOB 1996-01-22, MRN [id number] PCP:  Pcp Not In System  Cardiologist:  Dr. 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 Prior to this admission he had not significant medical history. He was recently diagnosed with strep throat starting Augmentin on 1/22. He presented to [facility] on 1/24 after developing sternal shooting chest pain around 10 PM on 1/23. Pain was worse when laying down and relieved when sitting up. He initially left the first time he came to the ED, but the pain returned prompting him to come back for evaluation. His D-dimer was found to be elevated at 655 and initial troponin was elevated at 1.65 with repeat value trending up to 3.92. ECG showed ST elevation in leads II, III, and aVF along with deep TWI in V1. CXR was negative. CTA was negative for PE. He denied any tobacco or illicit drug abuse. There is no known family history of CAD. He underwent cardiac cath that showed left main normal, LAD with a long region of 40% stenosis in the proximal to mid LAD after diagonal branch. 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 LCx and RCA with no significant stenosis. Normal EF and no WMA. Echo showed EF 60-65%, no RWMA, LV diastolic function parameters were normal, left atrium was normal in size, RV systolic function was normal, PASP normal - normal study.   Today, he is doing well. Rare fleeting episodes of chest discomfort lasting a few seconds when lasting down that self resolve. Nowhere nearly as intense as before. Tolerating medications without issues. Strep throat has resolved. Afebrile. No chills. No SOB or  tachy-palpitations. He will be moving back to [place] after graduation in May 2017.     Past Medical History  Diagnosis Date  . Strep throat 08/2015  . Myocarditis (HCC)     a. cardiac cath 08/2015: left main normal, LAD with a long region of 40% stenosis in the proximal to mid LAD after diagonal branch. 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 LCx and RCA with no significant stenosis. Normal EF and no WMA  . History of echocardiogram     a. 08/2015: EF 60-65%, no WMA, nl LV diastolic fxn parameters, nl LA size, nl RV sys fxn, PASP nl - nl study    Past Surgical History  Procedure Laterality Date  . None    . Cardiac catheterization N/A 09/15/2015    Procedure: Left Heart Cath and Coronary Angiography;  Surgeon: Antonieta Iba, MD;  Location: [facility] INVASIVE CV LAB;  Service: Cardiovascular;  Laterality: N/A;    Current Outpatient Prescriptions  Medication Sig Dispense Refill  . aspirin EC 81 MG EC tablet Take 1 tablet (81 mg total) by mouth daily. 30 tablet 0  . metoprolol succinate (TOPROL-XL) 25 MG 24 hr tablet Take 1 tablet (25 mg total) by mouth daily. Take with or immediately following a meal. 30 tablet 1  . nitroGLYCERIN (NITROSTAT) 0.4 MG SL tablet Place 1 tablet (0.4 mg total) under the tongue every 5 (five) minutes x 3 doses as needed for chest pain (as needed for chest pain). 30 tablet 0   No  current facility-administered medications for this visit.    Allergies:   Review of patient's allergies indicates no known allergies.   Social History:  The patient  reports that he has never smoked. He does not have any smokeless tobacco history on file. He reports that he drinks alcohol. He reports that he does not use illicit drugs.   Family History:  The patient's family history includes Prostate cancer in his paternal grandfather.  ROS:   Review of Systems  Constitutional: Negative for fever, chills, weight loss, malaise/fatigue and  diaphoresis.  HENT: Negative for congestion.   Eyes: Negative for discharge and redness.  Respiratory: Negative for cough, sputum production, shortness of breath and wheezing.   Cardiovascular: Positive for chest pain. Negative for palpitations, orthopnea, claudication, leg swelling and PND.       CP much improved  Gastrointestinal: Negative for nausea, vomiting and abdominal pain.  Musculoskeletal: Negative for myalgias and falls.  Skin: Negative for rash.  Neurological: Negative for dizziness, sensory change, speech change, focal weakness, loss of consciousness and weakness.  Endo/Heme/Allergies: Does not bruise/bleed easily.  Psychiatric/Behavioral: Negative for substance abuse. The patient is not nervous/anxious.   All other systems reviewed and are negative.    PHYSICAL EXAM:  VS:  BP 120/70 mmHg  Pulse 68  Ht  (1.753 m)  Wt 191 lb 8 oz (86.864 kg)  BMI 28.27 kg/m2 BMI: Body mass index is 28.27 kg/(m^2). Well nourished, well developed, in no acute distress HEENT: normocephalic, atraumatic Neck: no JVD, carotid bruits or masses Cardiac:  normal S1, S2; RRR; no murmurs, rubs, or gallops Lungs:  clear to auscultation bilaterally, no wheezing, rhonchi or rales Abd: soft, nontender, no hepatomegaly, + BS MS: no deformity or atrophy Ext: no edema, cath site without bruit, bleeding, erythema, bruising, swelling, or TTP Skin: warm and dry, no rash Neuro:  moves all extremities spontaneously, no focal abnormalities noted, follows commands Psych: euthymic mood, full affect   EKG:  Was ordered today. Shows NSR, 68 bpm, incomplete RBBB, inferolateral TWI  Recent Labs: 09/15/2015: BUN 9; Creatinine, Ser 0.88; Hemoglobin 15.5; Platelets 218; Potassium 3.7; Sodium 136  No results found for requested labs within last 365 days.   Estimated Creatinine Clearance: 145 mL/min (by C-G formula based on Cr of 0.88).   Wt Readings from Last 3 Encounters:  10/01/15 191 lb 8 oz (86.864 kg)    09/15/15 195 lb (88.451 kg)     Other studies reviewed: Additional studies/records reviewed today include: summarized above  ASSESSMENT AND PLAN:  1. Myo-pericarditis: -Much improved -Continue Toprol XL 25 mg daily, can likely taper off next month -Continue aspirin 81 mg daily -If he has any increased pain could use colchicine, he declines this at this time as his pain is insignificant and much better   2. History of strep throat: -Resolved  Disposition: F/u with Dr. Mariah Milling in April prior to moving back to [facility]  Current medicines are reviewed at length with the patient today.  The patient did not have any concerns regarding medicines.  Elinor Dodge PA-C 10/01/2015 9:19 AM     [facility] -  [address] [place], [place] [address] [phone]

## 2015-11-30 ENCOUNTER — Encounter: Payer: Self-pay | Admitting: Cardiovascular Disease

## 2015-11-30 ENCOUNTER — Ambulatory Visit (INDEPENDENT_AMBULATORY_CARE_PROVIDER_SITE_OTHER): Payer: BLUE CROSS/BLUE SHIELD | Admitting: Cardiovascular Disease

## 2015-11-30 VITALS — BP 140/80 | HR 51 | Ht 69.0 in | Wt 186.5 lb

## 2015-11-30 DIAGNOSIS — R9431 Abnormal electrocardiogram [ECG] [EKG]: Secondary | ICD-10-CM | POA: Diagnosis not present

## 2015-11-30 DIAGNOSIS — R7989 Other specified abnormal findings of blood chemistry: Secondary | ICD-10-CM | POA: Diagnosis not present

## 2015-11-30 DIAGNOSIS — R778 Other specified abnormalities of plasma proteins: Secondary | ICD-10-CM

## 2015-11-30 MED ORDER — METOPROLOL SUCCINATE ER 25 MG PO TB24: 25.0000 mg | ORAL_TABLET | Freq: Every day | ORAL | Status: AC | PRN

## 2015-11-30 NOTE — Progress Notes (Signed)
Patient ID: Brad Bernard, male    DOB: 1995/12/13, 20 y.o.   MRN: [id number]  HPI Comments: Brad Bernard is a 20 y.o. 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   presented to [facility] on 1/24 after developing sternal shooting chest pain around 10 PM on 1/23.   initial troponin was elevated at 1.65 with repeat value trending up to 3.92.  ECG showed ST elevation in leads II, III, and aVF along with deep TWI in V1. CXR was negative.  CTA was negative for PE. He denied any tobacco or illicit drug abuse.    cardiac cath that showed left main normal, LAD with a long region of 40% stenosis in the proximal to mid LAD after diagonal branch. 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 LCx and RCA with no significant stenosis. Normal EF and no WMA.   Echo showed EF 60-65%, no RWMA, LV diastolic function parameters were normal, left atrium was normal in size, RV systolic function was normal, PASP normal - normal study.     No Known Allergies  Current Outpatient Prescriptions on File Prior to Visit  Medication Sig Dispense Refill  . aspirin EC 81 MG EC tablet Take 1 tablet (81 mg total) by mouth daily. (Patient taking differently: Take 81 mg by mouth every other  day. ) 30 tablet 0  . nitroGLYCERIN (NITROSTAT) 0.4 MG SL tablet Place 1 tablet (0.4 mg total) under the tongue every 5 (five) minutes x 3 doses as needed for chest pain (as needed for chest pain). 30 tablet 0   No current facility-administered medications on file prior to visit.    Past Medical History  Diagnosis Date  . Strep throat 08/2015  . Myocarditis (HCC)     a. cardiac cath 08/2015: left main normal, LAD with a long region of 40% stenosis in the proximal to mid LAD after diagonal branch. 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 LCx and RCA with no significant stenosis. Normal EF and no WMA  . History of echocardiogram     a. 08/2015: EF 60-65%, no WMA, nl LV diastolic fxn parameters, nl LA size, nl RV sys fxn, PASP nl - nl study    Past Surgical History  Procedure Laterality Date  . None    . Cardiac catheterization N/A 09/15/2015    Procedure: Left Heart Cath and Coronary Angiography;  Surgeon: Antonieta Iba, MD;  Location: [facility] INVASIVE CV LAB;  Service: Cardiovascular;  Laterality: N/A;    Social History  reports that he has never smoked. He does not have any smokeless tobacco history on file. He reports that he drinks alcohol. He reports that he does not use illicit drugs.  Family History family history includes Prostate cancer in his paternal grandfather.  Review of Systems  Constitutional: Negative.   Respiratory: Negative.   Cardiovascular: Negative.   Gastrointestinal: Negative.   Musculoskeletal: Negative.   Neurological: Negative.   Hematological: Negative.   Psychiatric/Behavioral: Negative.   All other systems reviewed and are negative.   BP 140/80 mmHg  Pulse 51  Ht 5\' 9"  (1.753 m)  Wt 186 lb 8 oz (84.596 kg)  BMI 27.53 kg/m2  Physical Exam  Constitutional: He is oriented to person, place, and time. He appears well-developed and well-nourished.  HENT:  Head: Normocephalic.  Nose: Nose normal.   Mouth/Throat: Oropharynx is clear and moist.  Eyes: Conjunctivae are normal. Pupils are equal, round, and reactive to light.  Neck: Normal range of motion. Neck supple. No JVD present.  Cardiovascular: Normal rate, regular rhythm, normal heart sounds and intact distal pulses.  Exam reveals no gallop and no friction rub.   No murmur heard. Pulmonary/Chest: Effort normal and breath sounds normal. No respiratory distress. He has no wheezes. He has no rales. He exhibits no tenderness.  Abdominal: Soft. Bowel sounds are normal. He exhibits no distension. There is no tenderness.  Musculoskeletal: Normal range of motion. He exhibits no edema or tenderness.  Lymphadenopathy:    He has no cervical adenopathy.  Neurological: He is alert and oriented to person, place, and time. Coordination normal.  Skin: Skin is warm and dry. No rash noted. No erythema.  Psychiatric: He has a normal mood and affect. His behavior is normal. Judgment and thought content normal.

## 2015-11-30 NOTE — Assessment & Plan Note (Signed)
History of elevated troponin in the setting of strep throat, cardiac catheterization showing myocardial bridging otherwise no significant disease CT scan of the chest with no coronary calcifications noted Currently not having symptoms, Recommended he could wean himself off the metoprolol He is moving to [place], recommended he call us if he has any further symptoms

## 2015-11-30 NOTE — Patient Instructions (Addendum)
You are doing well. No medication changes were made.  Take metoprolol as needed  Please call us if you have new issues that need to be addressed before your next appt.

## 2017-11-04 IMAGING — CT CT ANGIO CHEST
1 of 2 series · 18 of 30 positions shown · IV contrast (APPLIED)
Comparison: Chest radiograph performed earlier today at [DATE] a.m.

CLINICAL DATA: Acute onset of central chest pain. Initial
encounter.

EXAM:
CT ANGIOGRAPHY CHEST WITH CONTRAST
TECHNIQUE: Multidetector CT imaging of the chest was performed using the
standard protocol during bolus administration of intravenous
contrast. Multiplanar CT image reconstructions and MIPs were
obtained to evaluate the vascular anatomy.
CONTRAST:  100mL OMNIPAQUE IOHEXOL 350 MG/ML SOLN

[Series 5: pe 1.0 thins · axial · 0.77mm/px · z∈[+8,+248]mm · 18 of 272 slices shown]
[im 16/272  lung]
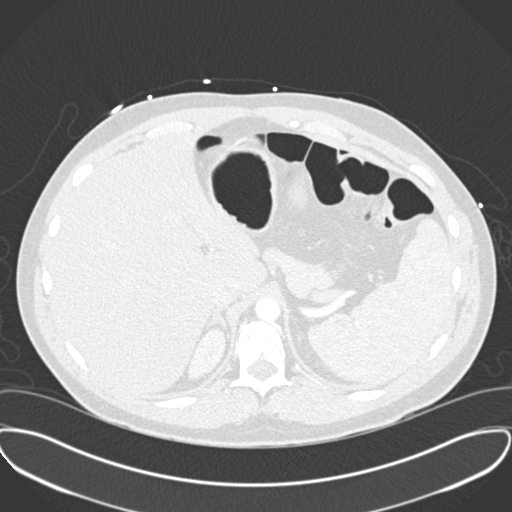
[im 31/272  mediastinal]
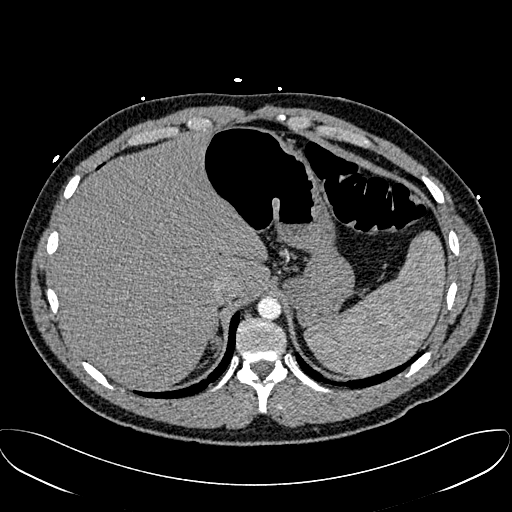
[im 46/272  lung]
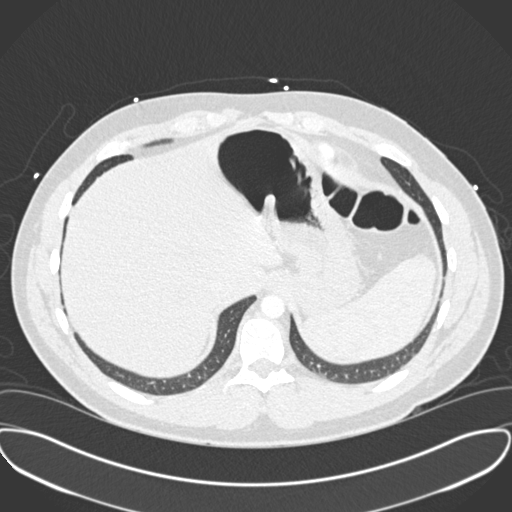
[im 61/272  mediastinal]
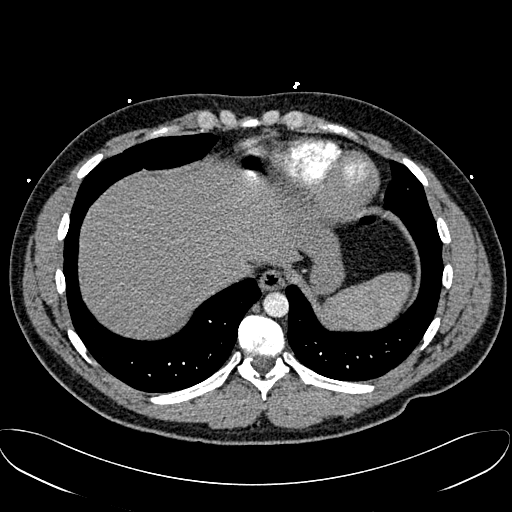
[im 76/272  lung]
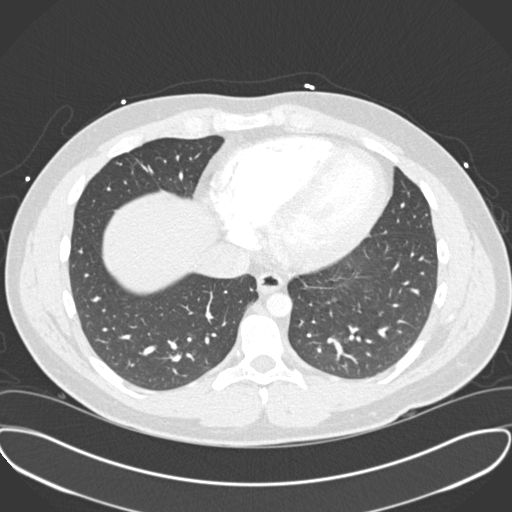
[im 91/272  mediastinal]
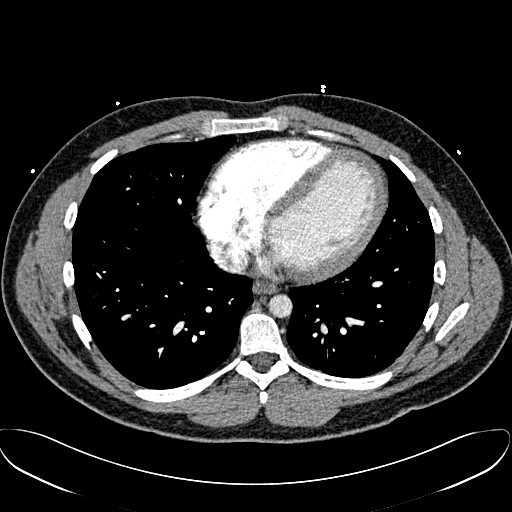
[im 106/272  lung]
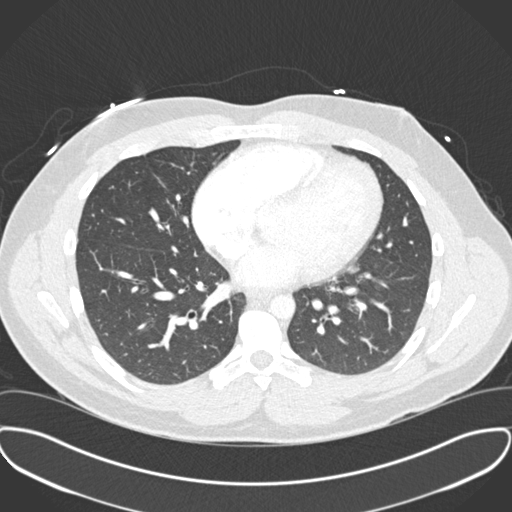
[im 121/272  mediastinal]
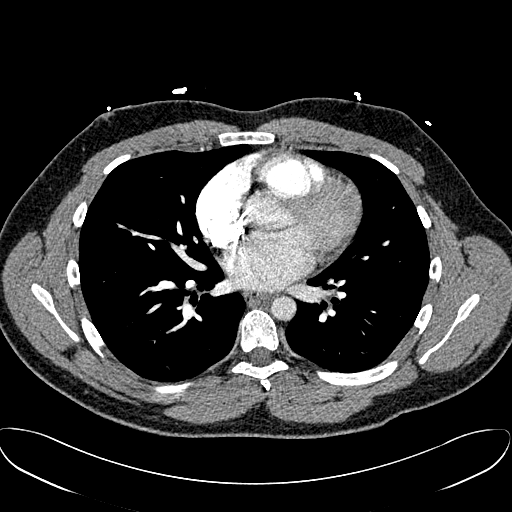
[im 127/272  lung]
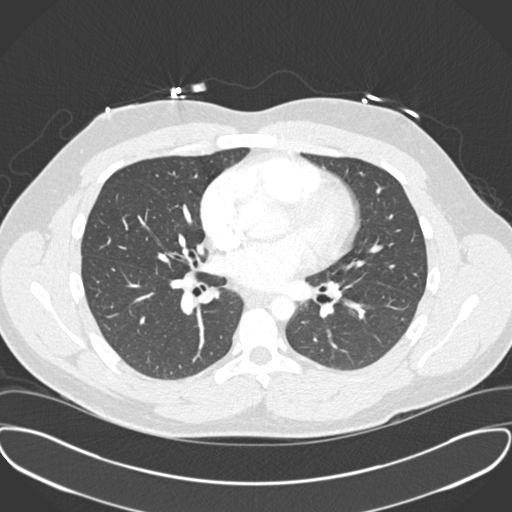
[im 136/272  mediastinal]
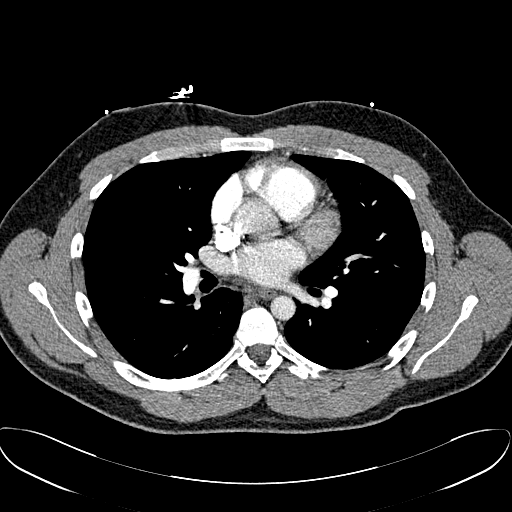
[im 151/272  lung]
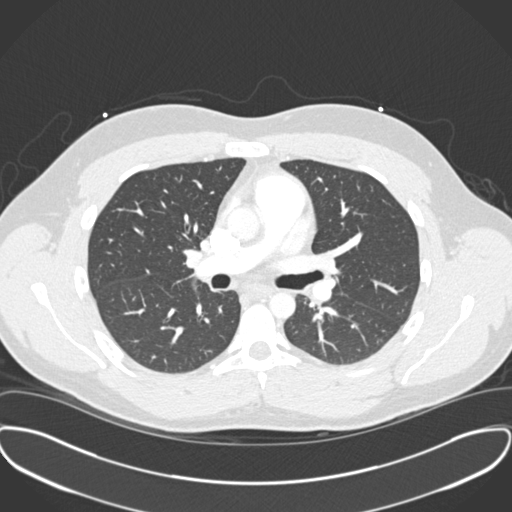
[im 166/272  mediastinal]
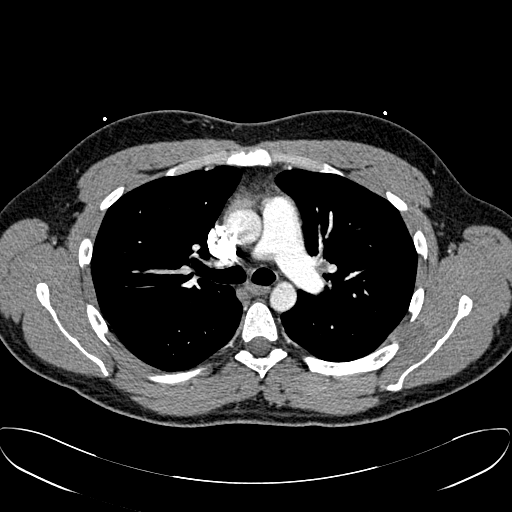
[im 181/272  lung]
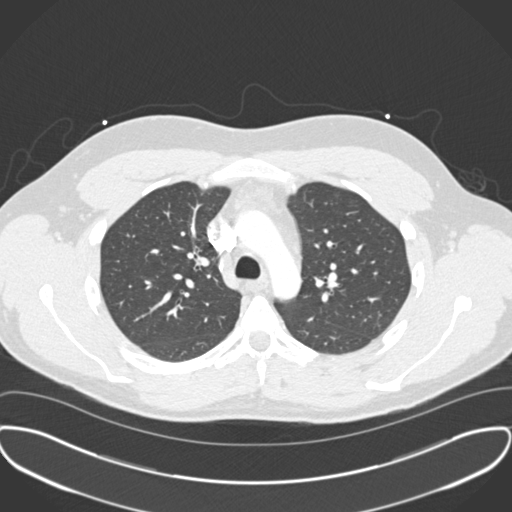
[im 196/272  mediastinal]
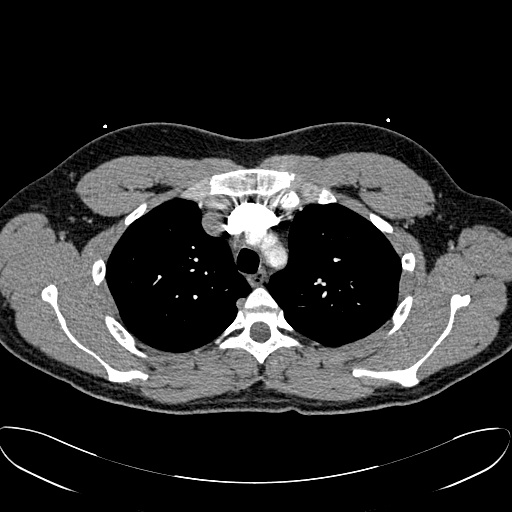
[im 211/272  lung]
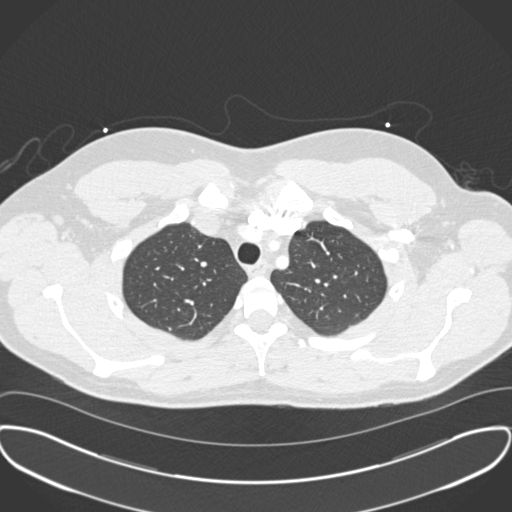
[im 226/272  mediastinal]
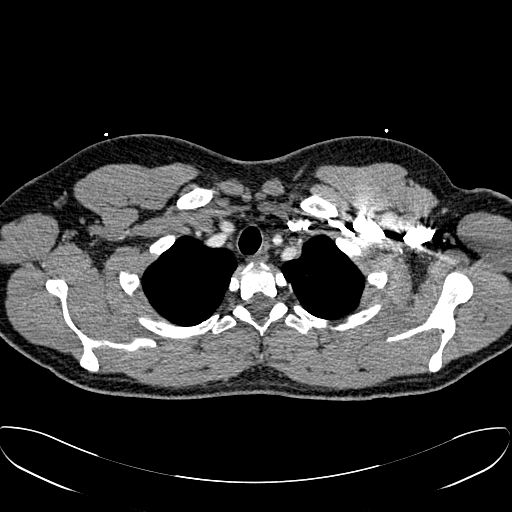
[im 241/272  lung]
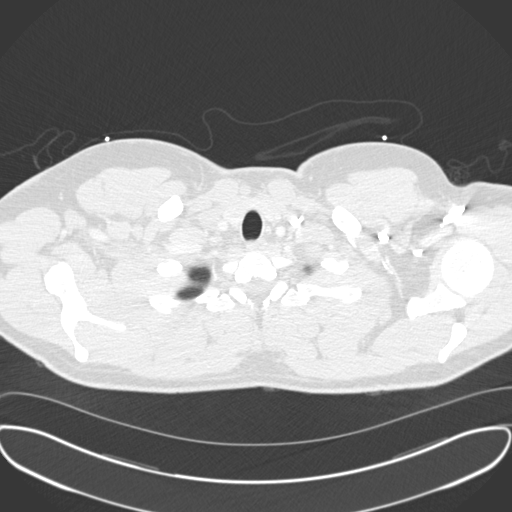
[im 256/272  mediastinal]
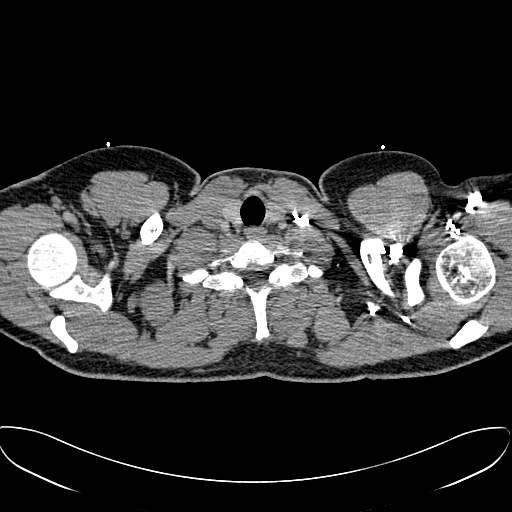

[18 of 30 positions shown; findings below may reference images not displayed]

FINDINGS: There is no evidence of pulmonary embolus.

The lungs are clear bilaterally. There is no evidence of significant
focal consolidation, pleural effusion or pneumothorax. No masses are
identified; no abnormal focal contrast enhancement is seen.

The mediastinum is unremarkable appearance. No mediastinal
lymphadenopathy is seen. No pericardial effusion is identified. The
great vessels are grossly unremarkable in appearance. No axillary
lymphadenopathy is seen. The visualized portions of the thyroid
gland are unremarkable in appearance.

The visualized portions of the liver and spleen are unremarkable.
The visualized portions of the pancreas, stomach, adrenal glands and
right kidney are within normal limits.

No acute osseous abnormalities are seen.

Review of the MIP images confirms the above findings.
IMPRESSION: No evidence of pulmonary embolus.  Lungs clear bilaterally.
